# Patient Record
Sex: Female | Born: 1980 | Race: White | Hispanic: No | Marital: Single
Health system: Southern US, Community
[De-identification: ages and names within clinical notes are randomized; demographics above are authoritative.]

## PROBLEM LIST (undated history)

## (undated) ENCOUNTER — Inpatient Hospital Stay (HOSPITAL_COMMUNITY): Payer: Self-pay

## (undated) DIAGNOSIS — F32A Depression, unspecified: Secondary | ICD-10-CM

## (undated) DIAGNOSIS — F329 Major depressive disorder, single episode, unspecified: Secondary | ICD-10-CM

## (undated) DIAGNOSIS — F419 Anxiety disorder, unspecified: Secondary | ICD-10-CM

---

## 2010-07-20 ENCOUNTER — Emergency Department (HOSPITAL_COMMUNITY): Payer: PRIVATE HEALTH INSURANCE

## 2010-07-20 ENCOUNTER — Emergency Department (HOSPITAL_COMMUNITY)
Admission: EM | Admit: 2010-07-20 | Discharge: 2010-07-20 | Disposition: A | Discharge status: Home / Self Care | Payer: PRIVATE HEALTH INSURANCE | Attending: Emergency Medicine | Admitting: Emergency Medicine

## 2010-07-20 DIAGNOSIS — M545 Low back pain, unspecified: Secondary | ICD-10-CM | POA: Insufficient documentation

## 2010-07-20 DIAGNOSIS — O200 Threatened abortion: Secondary | ICD-10-CM | POA: Insufficient documentation

## 2010-07-20 DIAGNOSIS — R109 Unspecified abdominal pain: Secondary | ICD-10-CM | POA: Insufficient documentation

## 2010-07-20 LAB — URINALYSIS, ROUTINE W REFLEX MICROSCOPIC
Bilirubin Urine: NEGATIVE
Ketones, ur: NEGATIVE mg/dL
Specific Gravity, Urine: 1.017 (ref 1.005–1.030)
Urobilinogen, UA: 0.2 mg/dL (ref 0.0–1.0)

## 2010-07-20 LAB — WET PREP, GENITAL: Yeast Wet Prep HPF POC: NONE SEEN

## 2010-07-20 LAB — POCT PREGNANCY, URINE: Preg Test, Ur: NEGATIVE

## 2010-07-20 LAB — URINE MICROSCOPIC-ADD ON

## 2010-07-22 LAB — GC/CHLAMYDIA PROBE AMP, GENITAL: GC Probe Amp, Genital: NEGATIVE

## 2011-04-24 ENCOUNTER — Encounter (HOSPITAL_COMMUNITY): Payer: Self-pay

## 2011-04-24 ENCOUNTER — Inpatient Hospital Stay (HOSPITAL_COMMUNITY)
Admission: AD | Admit: 2011-04-24 | Discharge: 2011-04-24 | Disposition: A | Discharge status: Home / Self Care | Payer: No Typology Code available for payment source | Source: Ambulatory Visit | Attending: Obstetrics and Gynecology | Admitting: Obstetrics and Gynecology

## 2011-04-24 ENCOUNTER — Other Ambulatory Visit: Payer: Self-pay | Admitting: Obstetrics and Gynecology

## 2011-04-24 DIAGNOSIS — O00109 Unspecified tubal pregnancy without intrauterine pregnancy: Secondary | ICD-10-CM | POA: Insufficient documentation

## 2011-04-24 LAB — DIFFERENTIAL
Basophils Relative: 0 % (ref 0–1)
Eosinophils Absolute: 0.2 10*3/uL (ref 0.0–0.7)
Monocytes Absolute: 0.9 10*3/uL (ref 0.1–1.0)
Monocytes Relative: 7 % (ref 3–12)

## 2011-04-24 LAB — CBC
HCT: 37.8 % (ref 36.0–46.0)
Hemoglobin: 13 g/dL (ref 12.0–15.0)
MCH: 31.3 pg (ref 26.0–34.0)
MCHC: 34.4 g/dL (ref 30.0–36.0)

## 2011-04-24 NOTE — Progress Notes (Signed)
Pt states called the office while waiting in [facility]. Was told she could forgo the methotrexate injection today and get a repeat ultrasound in the office on Monday. Patient has decided that she doesn't want the injection today because she wants to "make sure" its an ectopic. Called Dr. Billy Coast and received an order for the patient to be discharged without the injection.

## 2011-04-24 NOTE — Discharge Instructions (Signed)
Methotrexate injection What is this medicine? METHOTREXATE (METH oh TREX ate) is a chemotherapy drug. This medicine affects cells that are rapidly growing, such as cancer cells and cells in your mouth and stomach. It is used to treat many cancers and other medical conditions. It is used for leukemias, lymphomas, breast cancer, lung cancer, head and neck cancers, and other cancers. This medicine also works on the immune system and is commonly used to treat psoriasis and rheumatoid arthritis. This medicine may be used for other purposes; ask your health care provider or pharmacist if you have questions. What should I tell my health care provider before I take this medicine? They need to know if you have any of these conditions: -if you frequently drink alcohol containing drinks -infection (especially a virus infection such as chickenpox, cold sores, or herpes) -immune system problems -kidney disease -liver disease -low blood counts, like platelets, red bloods, or white blood cells -lung disease -recent or ongoing radiation therapy -an unusual or allergic reaction to methotrexate, benzyl alcohol, other medicines, foods, dyes, or preservatives -pregnant or trying to get pregnant -breast-feeding How should I use this medicine? This drug is given as an injection into a muscle or into a vein. It may also be given into the spinal fluid. It is administered in a hospital or clinic by a specially trained health care professional. Talk to your pediatrician regarding the use of this medicine in children. While this drug may be prescribed for selected conditions, precautions do apply. Overdosage: If you think you have taken too much of this medicine contact a poison control center or emergency room at once. NOTE: This medicine is only for you. Do not share this medicine with others. What if I miss a dose? It is important not to miss your dose. Call your doctor or health care professional if you are unable  to keep an appointment. What may interact with this medicine? -antibiotics and other medicines for infections -aspirin and aspirin-like medicines including bismuth subsalicylate (Pepto-Bismol) -cisplatin -dapsone -folic acid in supplements or vitamins -mercaptopurine -NSAIDs, medicines for pain and inflammation, like ibuprofen or naproxen -pemetrexed -phenylbutazone -phenytoin -probenecid -pyrimethamine -theophylline -trimetrexate -vaccines This list may not describe all possible interactions. Give your health care provider a list of all the medicines, herbs, non-prescription drugs, or dietary supplements you use. Also tell them if you smoke, drink alcohol, or use illegal drugs. Some items may interact with your medicine. What should I watch for while using this medicine? Visit your doctor for checks on your progress. You will need to have regular blood checks during your treatment to monitor your blood, liver function, and kidney function. This drug may make you feel generally unwell. This is not uncommon, as chemotherapy can affect healthy cells as well as cancer cells. Report any side effects. Continue your course of treatment even though you feel ill unless your doctor tells you to stop. In some cases, you may be given additional medicines to help with side effects. Follow all directions for their use. Call your doctor or health care professional for advice if you get a fever, chills or sore throat, or other symptoms of a cold or flu. Do not treat yourself. This drug decreases your body's ability to fight infections. Try to avoid being around people who are sick. This medicine may increase your risk to bruise or bleed. Call your doctor or health care professional if you notice any unusual bleeding. Be careful brushing and flossing your teeth or using a toothpick because   you may get an infection or bleed more easily. If you have any dental work done, tell your dentist you are receiving  this medicine. Avoid taking products that contain aspirin, acetaminophen, ibuprofen, naproxen, or ketoprofen unless instructed by your doctor. These medicines may hide a fever. This medicine can make you more sensitive to the sun. Keep out of the sun. If you cannot avoid being in the sun, wear protective clothing and use sunscreen. Do not use sun lamps or tanning beds/booths. Do not treat diarrhea with over the counter products. Contact your doctor if you have diarrhea. To protect your kidneys, drink water or other fluids as directed while you are taking this medicine. Do not drink alcohol-containing drinks while taking this medicine. Both alcohol and the medicine may cause damage to your liver. Men and women must use effective birth control while they are taking this medicine. Do not become pregnant while taking this medicine. Women must continue using effective birth control for 1 full menstrual cycle after stopping this medicine. Tell your doctor right away if you think that you or your partner might be pregnant. There is a potential for serious side effects to an unborn child. Talk to your health care professional or pharmacist for more information. Do not breast-feed an infant while taking this medicine. Men must continue effective birth control for 3 months after stopping this medicine. What side effects may I notice from receiving this medicine? Side effects that you should report to your doctor or health care professional as soon as possible: -allergic reactions like skin rash, itching or hives, swelling of the face, lips, or tongue -low blood counts - this medicine may decrease the number of white blood cells, red blood cells and platelets. You may be at increased risk for infections and bleeding. -signs of infection - fever or chills, cough, sore throat, pain or difficulty passing urine -signs of decreased platelets or bleeding - bruising, pinpoint red spots on the skin, black, tarry stools,  blood in the urine -signs of decreased red blood cells - unusually weak or tired, fainting spells, lightheadedness -breathing problems, like a dry cough -changes in vision -confusion, not alert -diarrhea -mouth or throat sores or ulcers -problems with balance, talking, walking -redness, blistering, peeling or loosening of the skin, including inside the mouth -seizures -trouble passing urine or change in the amount of urine -vomiting -yellowing of the eyes or skin Side effects that usually do not require medical attention (report to your doctor or health care professional if they continue or are bothersome): -change in skin color -eye irritation -hair loss -headache -loss of appetite -nausea -stomach upset This list may not describe all possible side effects. Call your doctor for medical advice about side effects. You may report side effects to FDA at 1-800-FDA-1088. Where should I keep my medicine? This drug is given in a hospital or clinic and will not be stored at home. NOTE: This sheet is a summary. It may not cover all possible information. If you have questions about this medicine, talk to your doctor, pharmacist, or health care provider.  2012, [employer]. (08/25/2007 11:13:24 AM)

## 2011-04-24 NOTE — Progress Notes (Signed)
Patient to [facility] from the office for methotrexate injection. States labs were drawn in the office.

## 2011-04-24 NOTE — Progress Notes (Signed)
Addended by: Olivia Mackie on: 04/24/2011 09:13 PM   Modules accepted: Orders

## 2011-04-27 ENCOUNTER — Encounter (HOSPITAL_COMMUNITY)
Admission: RE | Disposition: A | Discharge status: Home / Self Care | Payer: Self-pay | Source: Ambulatory Visit | Attending: Obstetrics and Gynecology

## 2011-04-27 ENCOUNTER — Encounter (HOSPITAL_COMMUNITY): Payer: Self-pay | Admitting: *Deleted

## 2011-04-27 ENCOUNTER — Ambulatory Visit (HOSPITAL_COMMUNITY): Payer: No Typology Code available for payment source | Admitting: Anesthesiology

## 2011-04-27 ENCOUNTER — Other Ambulatory Visit: Payer: Self-pay | Admitting: Obstetrics and Gynecology

## 2011-04-27 ENCOUNTER — Encounter (HOSPITAL_COMMUNITY): Payer: Self-pay | Admitting: Anesthesiology

## 2011-04-27 ENCOUNTER — Ambulatory Visit (HOSPITAL_COMMUNITY)
Admission: RE | Admit: 2011-04-27 | Discharge: 2011-04-27 | Disposition: A | Discharge status: Home / Self Care | Payer: No Typology Code available for payment source | Source: Ambulatory Visit | Attending: Obstetrics and Gynecology | Admitting: Obstetrics and Gynecology

## 2011-04-27 DIAGNOSIS — O00109 Unspecified tubal pregnancy without intrauterine pregnancy: Secondary | ICD-10-CM | POA: Insufficient documentation

## 2011-04-27 HISTORY — DX: Anxiety disorder, unspecified: F41.9

## 2011-04-27 HISTORY — DX: Major depressive disorder, single episode, unspecified: F32.9

## 2011-04-27 HISTORY — DX: Depression, unspecified: F32.A

## 2011-04-27 HISTORY — PX: LAPAROSCOPY: SHX197

## 2011-04-27 LAB — SURGICAL PCR SCREEN: MRSA, PCR: NEGATIVE

## 2011-04-27 SURGERY — LAPAROSCOPY OPERATIVE
Anesthesia: General | Site: Abdomen | Laterality: Right | Wound class: Clean Contaminated

## 2011-04-27 MED ORDER — LIDOCAINE HCL (CARDIAC) 20 MG/ML IV SOLN
INTRAVENOUS | Status: DC | PRN
  Administered 2011-04-27: 50 mg via INTRAVENOUS

## 2011-04-27 MED ORDER — FENTANYL CITRATE 0.05 MG/ML IJ SOLN
INTRAMUSCULAR | Status: AC
  Filled 2011-04-27: qty 5

## 2011-04-27 MED ORDER — MIDAZOLAM HCL 5 MG/5ML IJ SOLN
INTRAMUSCULAR | Status: DC | PRN
  Administered 2011-04-27: 2 mg via INTRAVENOUS

## 2011-04-27 MED ORDER — MUPIROCIN 2 % EX OINT
TOPICAL_OINTMENT | CUTANEOUS | Status: AC
  Administered 2011-04-27: 1 via NASAL
  Filled 2011-04-27: qty 22

## 2011-04-27 MED ORDER — NEOSTIGMINE METHYLSULFATE 1 MG/ML IJ SOLN
INTRAMUSCULAR | Status: DC | PRN
  Administered 2011-04-27: 3 mg via INTRAVENOUS

## 2011-04-27 MED ORDER — CEFAZOLIN SODIUM 1-5 GM-% IV SOLN
1.0000 g | INTRAVENOUS | Status: AC
  Administered 2011-04-27: 1 g via INTRAVENOUS

## 2011-04-27 MED ORDER — PROPOFOL 10 MG/ML IV EMUL
INTRAVENOUS | Status: AC
  Filled 2011-04-27: qty 50

## 2011-04-27 MED ORDER — KETOROLAC TROMETHAMINE 30 MG/ML IJ SOLN: 15.0000 mg | Freq: Once | INTRAMUSCULAR | Status: DC | PRN

## 2011-04-27 MED ORDER — FENTANYL CITRATE 0.05 MG/ML IJ SOLN: 25.0000 ug | INTRAMUSCULAR | Status: DC | PRN

## 2011-04-27 MED ORDER — LACTATED RINGERS IV SOLN
INTRAVENOUS | Status: DC
  Administered 2011-04-27 (×2): via INTRAVENOUS

## 2011-04-27 MED ORDER — KETOROLAC TROMETHAMINE 30 MG/ML IJ SOLN
INTRAMUSCULAR | Status: DC | PRN
  Administered 2011-04-27: 30 mg via INTRAVENOUS

## 2011-04-27 MED ORDER — MUPIROCIN 2 % EX OINT
TOPICAL_OINTMENT | Freq: Two times a day (BID) | CUTANEOUS | Status: DC
  Administered 2011-04-27: 1 via NASAL

## 2011-04-27 MED ORDER — ONDANSETRON HCL 4 MG/2ML IJ SOLN
INTRAMUSCULAR | Status: DC | PRN
  Administered 2011-04-27: 4 mg via INTRAVENOUS

## 2011-04-27 MED ORDER — NEOSTIGMINE METHYLSULFATE 1 MG/ML IJ SOLN
INTRAMUSCULAR | Status: AC
  Filled 2011-04-27: qty 10

## 2011-04-27 MED ORDER — FENTANYL CITRATE 0.05 MG/ML IJ SOLN
INTRAMUSCULAR | Status: AC
  Filled 2011-04-27: qty 2

## 2011-04-27 MED ORDER — LIDOCAINE HCL (CARDIAC) 20 MG/ML IV SOLN
INTRAVENOUS | Status: AC
  Filled 2011-04-27: qty 5

## 2011-04-27 MED ORDER — INDIGOTINDISULFONATE SODIUM 8 MG/ML IJ SOLN
INTRAMUSCULAR | Status: AC
  Filled 2011-04-27: qty 5

## 2011-04-27 MED ORDER — BUPIVACAINE HCL (PF) 0.25 % IJ SOLN
INTRAMUSCULAR | Status: AC
  Filled 2011-04-27: qty 30

## 2011-04-27 MED ORDER — GLYCOPYRROLATE 0.2 MG/ML IJ SOLN
INTRAMUSCULAR | Status: AC
  Filled 2011-04-27: qty 2

## 2011-04-27 MED ORDER — OXYCODONE-ACETAMINOPHEN 5-325 MG PO TABS: 1.0000 | ORAL_TABLET | ORAL | Status: AC | PRN

## 2011-04-27 MED ORDER — ROCURONIUM BROMIDE 100 MG/10ML IV SOLN
INTRAVENOUS | Status: DC | PRN
  Administered 2011-04-27: 40 mg via INTRAVENOUS
  Administered 2011-04-27: 10 mg via INTRAVENOUS

## 2011-04-27 MED ORDER — CEFAZOLIN SODIUM 1-5 GM-% IV SOLN
INTRAVENOUS | Status: AC
  Filled 2011-04-27: qty 50

## 2011-04-27 MED ORDER — MIDAZOLAM HCL 2 MG/2ML IJ SOLN
INTRAMUSCULAR | Status: AC
  Filled 2011-04-27: qty 2

## 2011-04-27 MED ORDER — ONDANSETRON HCL 4 MG/2ML IJ SOLN
INTRAMUSCULAR | Status: AC
  Filled 2011-04-27: qty 2

## 2011-04-27 MED ORDER — ROCURONIUM BROMIDE 50 MG/5ML IV SOLN
INTRAVENOUS | Status: AC
  Filled 2011-04-27: qty 1

## 2011-04-27 MED ORDER — KETOROLAC TROMETHAMINE 30 MG/ML IJ SOLN
INTRAMUSCULAR | Status: AC
  Filled 2011-04-27: qty 1

## 2011-04-27 MED ORDER — BUPIVACAINE HCL (PF) 0.25 % IJ SOLN
INTRAMUSCULAR | Status: DC | PRN
  Administered 2011-04-27: 10 mL

## 2011-04-27 MED ORDER — PROPOFOL 10 MG/ML IV EMUL
INTRAVENOUS | Status: DC | PRN
  Administered 2011-04-27: 150 mg via INTRAVENOUS

## 2011-04-27 MED ORDER — FENTANYL CITRATE 0.05 MG/ML IJ SOLN
INTRAMUSCULAR | Status: DC | PRN
  Administered 2011-04-27: 50 ug via INTRAVENOUS
  Administered 2011-04-27: 150 ug via INTRAVENOUS
  Administered 2011-04-27: 50 ug via INTRAVENOUS
  Administered 2011-04-27 (×2): 25 ug via INTRAVENOUS
  Administered 2011-04-27: 50 ug via INTRAVENOUS

## 2011-04-27 MED ORDER — GLYCOPYRROLATE 0.2 MG/ML IJ SOLN
INTRAMUSCULAR | Status: DC | PRN
  Administered 2011-04-27: .6 mg via INTRAVENOUS

## 2011-04-27 SURGICAL SUPPLY — 27 items
CABLE HIGH FREQUENCY MONO STRZ (ELECTRODE) ×4 IMPLANT
CATH ROBINSON RED A/P 16FR (CATHETERS) IMPLANT
CLOTH BEACON ORANGE TIMEOUT ST (SAFETY) ×4 IMPLANT
DERMABOND ADVANCED (GAUZE/BANDAGES/DRESSINGS) ×1
DERMABOND ADVANCED .7 DNX12 (GAUZE/BANDAGES/DRESSINGS) ×3 IMPLANT
FORCEPS CUTTING 33CM 5MM (CUTTING FORCEPS) ×4 IMPLANT
FORCEPS CUTTING 45CM 5MM (CUTTING FORCEPS) IMPLANT
GLOVE BIO SURGEON STRL SZ7.5 (GLOVE) ×8 IMPLANT
GOWN PREVENTION PLUS LG XLONG (DISPOSABLE) ×8 IMPLANT
GOWN PREVENTION PLUS XLARGE (GOWN DISPOSABLE) ×4 IMPLANT
NEEDLE INSUFFLATION 14GA 120MM (NEEDLE) IMPLANT
PACK LAPAROSCOPY BASIN (CUSTOM PROCEDURE TRAY) ×4 IMPLANT
PENCIL BUTTON HOLSTER BLD 10FT (ELECTRODE) ×4 IMPLANT
POUCH SPECIMEN RETRIEVAL 10MM (ENDOMECHANICALS) ×4 IMPLANT
PROTECTOR NERVE ULNAR (MISCELLANEOUS) ×4 IMPLANT
SET IRRIG TUBING LAPAROSCOPIC (IRRIGATION / IRRIGATOR) ×4 IMPLANT
SLEEVE Z-THREAD 5X100MM (TROCAR) ×4 IMPLANT
SOLUTION ELECTROLUBE (MISCELLANEOUS) ×4 IMPLANT
SUT VICRYL 0 UR6 27IN ABS (SUTURE) ×8 IMPLANT
SUT VICRYL 4-0 PS2 18IN ABS (SUTURE) ×4 IMPLANT
TOWEL OR 17X24 6PK STRL BLUE (TOWEL DISPOSABLE) ×8 IMPLANT
TRAY FOLEY CATH 14FR (SET/KITS/TRAYS/PACK) ×4 IMPLANT
TROCAR HASSON GELL 12X100 (TROCAR) ×4 IMPLANT
TROCAR Z-THREAD BLADED 11X100M (TROCAR) IMPLANT
TROCAR Z-THREAD BLADED 5X100MM (TROCAR) ×4 IMPLANT
WARMER LAPAROSCOPE (MISCELLANEOUS) ×4 IMPLANT
WATER STERILE IRR 1000ML POUR (IV SOLUTION) IMPLANT

## 2011-04-27 NOTE — Progress Notes (Signed)
Patient ID: Anita Chen, female   DOB: April 04, 1980, 31 y.o.   MRN: [id number] Patient seen and examined. Consent witnessed and signed. No changes noted. Update completed.

## 2011-04-27 NOTE — Op Note (Signed)
04/27/2011  4:23 PM  PATIENT:  Liticia Delford Field  31 y.o. female  PRE-OPERATIVE DIAGNOSIS:  ectopic  POST-OPERATIVE DIAGNOSIS:  ectopic  PROCEDURE:  Procedure(s): LAPAROSCOPY OPERATIVE UNILATERAL SALPINGECTOMY(right) Lysis of adhesions  SURGEON:  Surgeon(s): Lenoard Aden, MD  ASSISTANTS: none   ANESTHESIA:   local and general  ESTIMATED BLOOD LOSS: * No blood loss amount entered *   DRAINS: Urinary Catheter (Foley)   LOCAL MEDICATIONS USED:  MARCAINE     SPECIMEN:  Source of Specimen:  right tube with ectopic  DISPOSITION OF SPECIMEN:  PATHOLOGY  COUNTS:  YES  DICTATION #: [id number]  PLAN OF CARE: DC home  PATIENT DISPOSITION:  PACU - hemodynamically stable.

## 2011-04-27 NOTE — Transfer of Care (Signed)
Immediate Anesthesia Transfer of Care Note  Patient: Anita Chen  Procedure(s) Performed: Procedure(s) (LRB): LAPAROSCOPY OPERATIVE (N/A) UNILATERAL SALPINGECTOMY (Right)  Patient Location: PACU  Anesthesia Type: General  Level of Consciousness: awake, alert  and oriented  Airway & Oxygen Therapy: Patient Spontanous Breathing and Patient connected to nasal cannula oxygen  Post-op Assessment: Report given to PACU RN and Post -op Vital signs reviewed and stable  Post vital signs: Reviewed and stable  Complications: No apparent anesthesia complications

## 2011-04-27 NOTE — Anesthesia Procedure Notes (Signed)
Procedure Name: Intubation Date/Time: 04/27/2011 3:23 PM Performed by: Madison Hickman Pre-anesthesia Checklist: Patient identified, Emergency Drugs available, Suction available, Timeout performed and Patient being monitored Patient Re-evaluated:Patient Re-evaluated prior to inductionOxygen Delivery Method: Circle system utilized Preoxygenation: Pre-oxygenation with 100% oxygen Intubation Type: IV induction and Cricoid Pressure applied Laryngoscope Size: Mac and 3 Grade View: Grade I Tube type: Oral Tube size: 7.0 mm Number of attempts: 1 Airway Equipment and Method: Stylet Placement Confirmation: ETT inserted through vocal cords under direct vision,  positive ETCO2 and breath sounds checked- equal and bilateral Secured at: 21 cm Tube secured with: Tape Dental Injury: Teeth and Oropharynx as per pre-operative assessment

## 2011-04-27 NOTE — Anesthesia Preprocedure Evaluation (Signed)
Anesthesia Evaluation  Patient identified by MRN, date of birth, ID band Patient awake    Reviewed: Allergy & Precautions, H&P , NPO status , Patient's Chart, lab work & pertinent test results, reviewed documented beta blocker date and time   History of Anesthesia Complications Negative for: history of anesthetic complications  Airway Mallampati: I TM Distance: >3 FB Neck ROM: full    Dental  (+) Teeth Intact   Pulmonary neg pulmonary ROS,  clear to auscultation  Pulmonary exam normal       Cardiovascular neg cardio ROS regular Normal    Neuro/Psych PSYCHIATRIC DISORDERS (anxiety, depression) Negative Neurological ROS     GI/Hepatic negative GI ROS, Neg liver ROS,   Endo/Other  Negative Endocrine ROS  Renal/GU negative Renal ROS  Genitourinary negative   Musculoskeletal   Abdominal   Peds  Hematology negative hematology ROS (+)   Anesthesia Other Findings NPO since 10 am - ate 3 pork rinds and one bite cinnamon bun - emergency case per Dr Billy Coast  Reproductive/Obstetrics (+) Pregnancy (ectopic at 6 weeks)                           Anesthesia Physical Anesthesia Plan  ASA: II  Anesthesia Plan: General ETT   Post-op Pain Management:    Induction:   Airway Management Planned:   Additional Equipment:   Intra-op Plan:   Post-operative Plan:   Informed Consent: I have reviewed the patients History and Physical, chart, labs and discussed the procedure including the risks, benefits and alternatives for the proposed anesthesia with the patient or authorized representative who has indicated his/her understanding and acceptance.   Dental Advisory Given  Plan Discussed with: CRNA and Surgeon  Anesthesia Plan Comments:         Anesthesia Quick Evaluation

## 2011-04-27 NOTE — Anesthesia Postprocedure Evaluation (Signed)
  Anesthesia Post-op Note  Patient: Anita Chen  Procedure(s) Performed: Procedure(s) (LRB): LAPAROSCOPY OPERATIVE (N/A) UNILATERAL SALPINGECTOMY (Right) Patient is awake and responsive. Pain and nausea are reasonably well controlled. Vital signs are stable and clinically acceptable. Oxygen saturation is clinically acceptable. There are no apparent anesthetic complications at this time. Patient is ready for discharge.

## 2011-04-27 NOTE — Discharge Instructions (Signed)

## 2011-04-27 NOTE — H&P (Signed)
NAME:  Anita Chen, Anita Chen NO.:  [id number]  MEDICAL RECORD NO.:  [id number]  LOCATION:                                 FACILITY:  PHYSICIAN:  Lenoard Aden, M.D.     DATE OF BIRTH:  DATE OF ADMISSION: DATE OF DISCHARGE:                             HISTORY & PHYSICAL   CHIEF COMPLAINT:  Right ectopic with cardiac activity, right lower quadrant pain.  HISTORY OF PRESENT ILLNESS:  She is a 31 year old white female, G4, P 0- 3-0-0 with history of previous ectopic, treated with methotrexate who now presents with a history of a gestational sac in the right adnexa as noted 3 days ago.  The patient refused methotrexate.  Presents today with right-sided pain and fetal cardiac activity in the right adnexa, now for surgical therapy.  MEDICATIONS:  Prenatal vitamins.  No other medical, surgical, hospitalizations.  She has a history of SAB x2.  No history of PID.  She has allergies to BENZOYL PEROXIDE.  FAMILY HISTORY:  Diabetes, heart disease, hypertension, breast and ovarian cancer.  PHYSICAL EXAMINATION:  GENERAL:  She is a well-developed, well-nourished white female, in no acute distress. HEENT:  Normal. NECK:  Supple.  Full range of motion. LUNGS:  Clear. HEART:  Regular rhythm. ABDOMEN:  Soft, nontender. PELVIC:  Tenderness in the right adnexa. EXTREMITIES:  No cords. NEUROLOGIC:  Nonfocal. SKIN:  Intact.  IMPRESSION:  Right ectopic pregnancy with cardiac activity, right-sided pain, suspicious for impending rupture.  PLAN:  Proceed with urgent diagnostic laparoscopy, right salpingectomy with removal of ectopic.  Risks of anesthesia, infection, bleeding, injury to abdominal organs, and need for repair was discussed, delayed versus immediate complications to include bowel and bladder injury, possible need for repair noted.  The patient acknowledges and wishes to proceed.     Lenoard Aden, M.D.     RJT/MEDQ  D:  04/27/2011  T:   04/27/2011  Job:  [id number]

## 2011-04-28 NOTE — Op Note (Signed)
[name], [name]               ACCOUNT NO.:  [id number]  MEDICAL RECORD NO.:  [id number]  LOCATION:  [facility]                          FACILITY:  [facility]  PHYSICIAN:  Lenoard Aden, M.D.DATE OF BIRTH:  10-17-1980  DATE OF PROCEDURE:  04/27/2011 DATE OF DISCHARGE:  04/27/2011                              OPERATIVE REPORT   SURGEON:  Lenoard Aden, MD  DESCRIPTION OF PROCEDURE:  After being apprised of risks of anesthesia, infection, bleeding, injury to abdominal organs, need for repair, delayed versus immediate complications to include bowel and bladder injury, need for repair, the patient was brought to the operating room and was administered general anesthetic without complications.  Prepped and draped in usual sterile fashion.  Foley catheter was placed.  After achieving adequate anesthesia, dilute Marcaine solution was placed. Infraumbilical incision was made with scalpel.  Fascia was grasped and opened, and Hasson cannula was placed.  Please note that prior to this, a Hulka tenaculum was placed per vagina.  At this time, peritoneal entry was assured without evidence of trauma.  The laparoscope was placed. 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  At this time, two 5-mm ports were made on the right and left under direct visualization.  An Endo Shears were placed after elevating the right tube and ovary.  This complex was then dissected off the right pelvic sidewall.  Sharp lysis of adhesions was performed fraying the right adnexa from its attachments into the ovarian fossa. Good hemostasis was noted and then the gyrus was entered due to the fact that this was a recurrent ectopic on the right side.  Decision was made previously to remove the right tube, which was obviously unruptured with an  ampullary ectopic.  The mesosalpinx was cauterized and progressive bites using the gyrus and removed at the level of the cornua.  Good hemostasis was noted.  The specimen was placed and EndoCatch removed through the operative port and sent to pathology for permanent confirmation.  The left tube was difficult to visualize due to adnexal adhesions.  The proximal portion of the left tube appeared normal; however, this appeared to be significant adhesions in the left adnexa, which were not addressed today.  Good hemostasis was accomplished.  No bleeding from the right tube was noted after release of CO2.  All trocars were then removed under direct visualization.  CO2 was released. Incisions were closed using 0 Vicryl, 4-0 Vicryl, and Dermabond.  The Hulka tenaculum was removed from the vagina.  The patient tolerated the procedure well, was awakened and transferred to recovery in good condition.     Lenoard Aden, M.D.     RJT/MEDQ  D:  04/27/2011  T:  04/28/2011  Job:  [id number]

## 2011-04-29 ENCOUNTER — Encounter (HOSPITAL_COMMUNITY): Payer: Self-pay | Admitting: Obstetrics and Gynecology

## 2011-06-08 ENCOUNTER — Other Ambulatory Visit (HOSPITAL_COMMUNITY): Payer: Self-pay | Admitting: Obstetrics and Gynecology

## 2011-06-15 ENCOUNTER — Encounter (HOSPITAL_COMMUNITY): Payer: Self-pay

## 2011-06-15 ENCOUNTER — Ambulatory Visit (HOSPITAL_COMMUNITY)
Admission: RE | Admit: 2011-06-15 | Discharge: 2011-06-15 | Disposition: A | Discharge status: Home / Self Care | Payer: No Typology Code available for payment source | Source: Ambulatory Visit | Attending: Obstetrics and Gynecology | Admitting: Obstetrics and Gynecology

## 2011-06-15 DIAGNOSIS — N979 Female infertility, unspecified: Secondary | ICD-10-CM | POA: Insufficient documentation

## 2011-06-15 MED ORDER — IOHEXOL 300 MG/ML  SOLN: 7.0000 mL | Freq: Once | INTRAMUSCULAR | Status: AC | PRN

## 2013-05-29 IMAGING — RF DG HYSTEROGRAM
5 series · 5 of 5 positions shown · IV contrast (omnipaque)
Comparison: none

CLINICAL DATA: Infertility, history of right salpingectomy for
ectopic pregnancy April 2010

HYSTEROSALPINGOGRAM
TECHNIQUE: Following cleansing of the cervix and vagina with
Betadine solution, a hysterosalpingogram was performed using a 5-
French hysterosalpingogram catheter and Omnipaque 300 contrast.
The patient tolerated the exam without difficulty.
Fluoroscopy time:  0.7 minutes

[Series 1: run · 1 of 1 slices shown (1 of 5)]
[im 1/1]
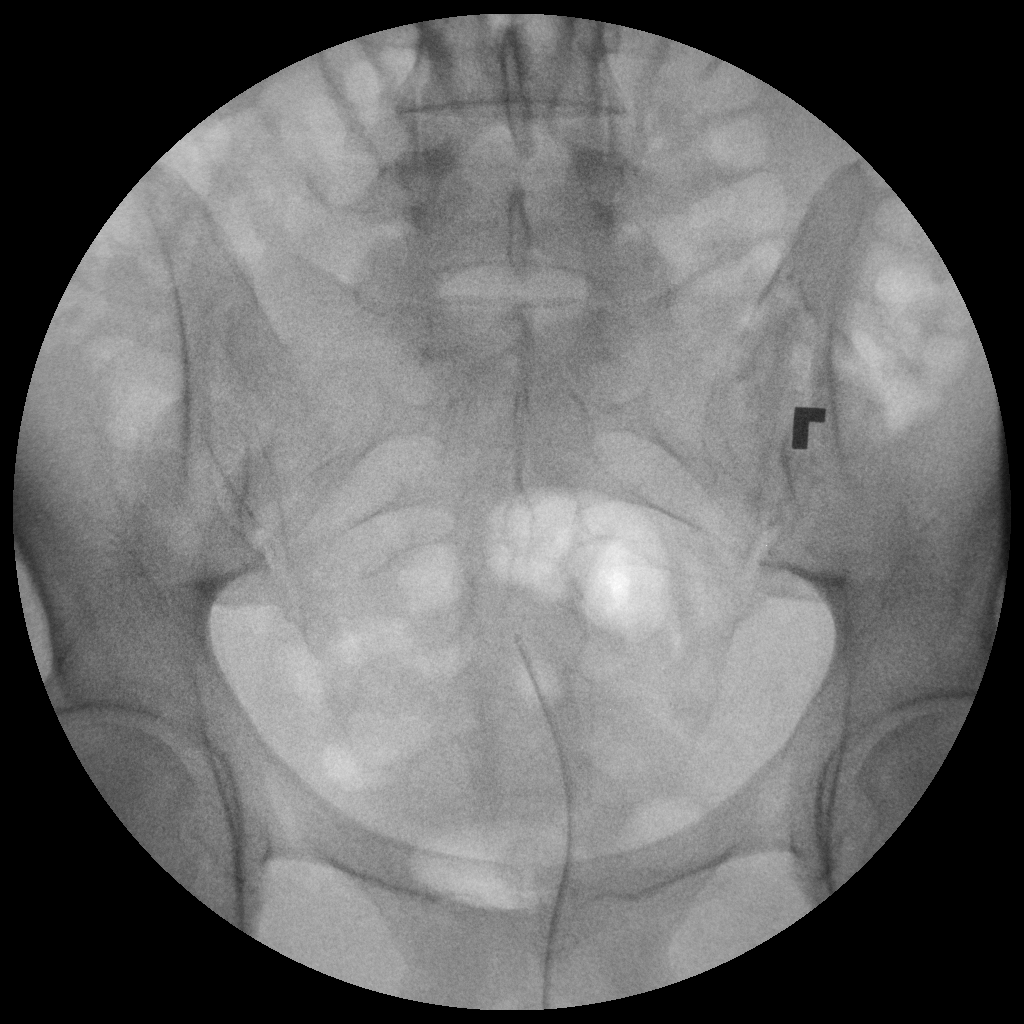

[Series 2: run · 1 of 1 slices shown (2 of 5)]
[im 1/1]
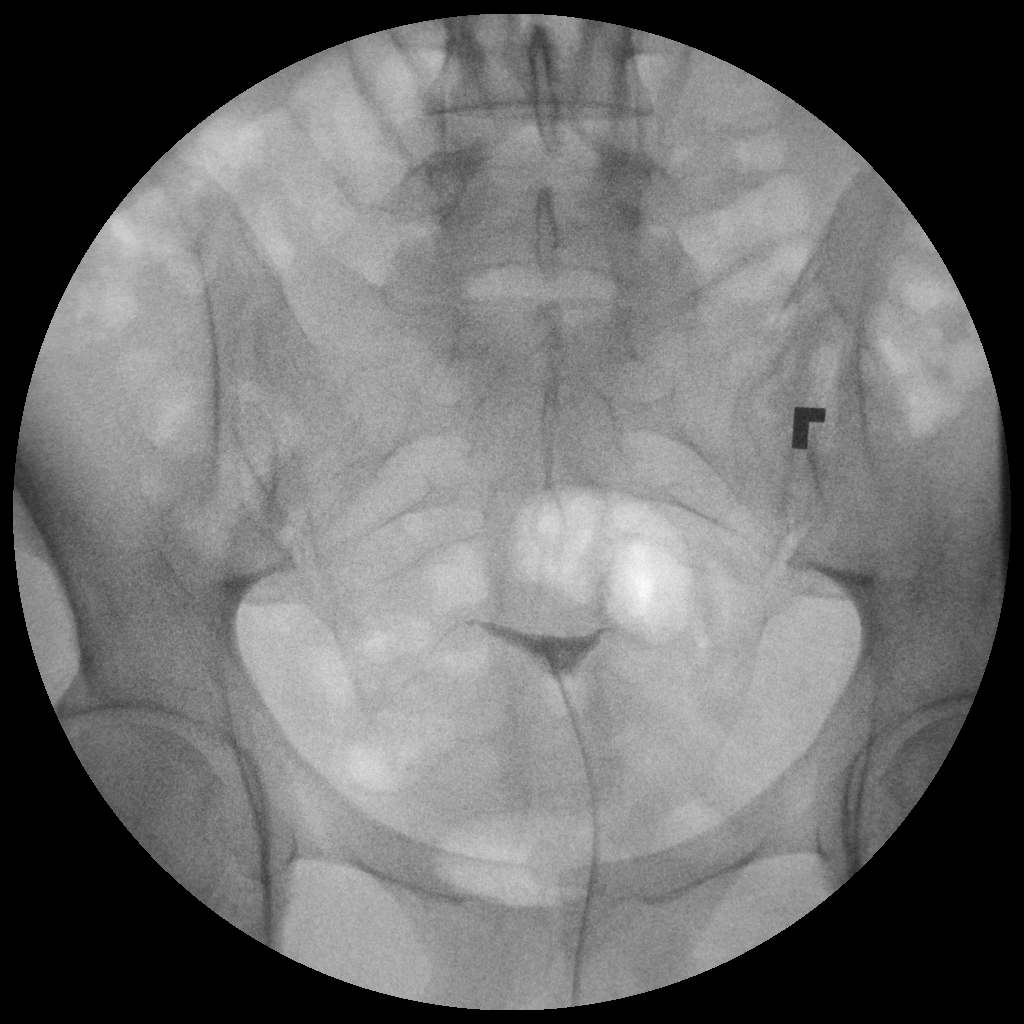

[Series 3: run · 1 of 1 slices shown (3 of 5)]
[im 1/1]
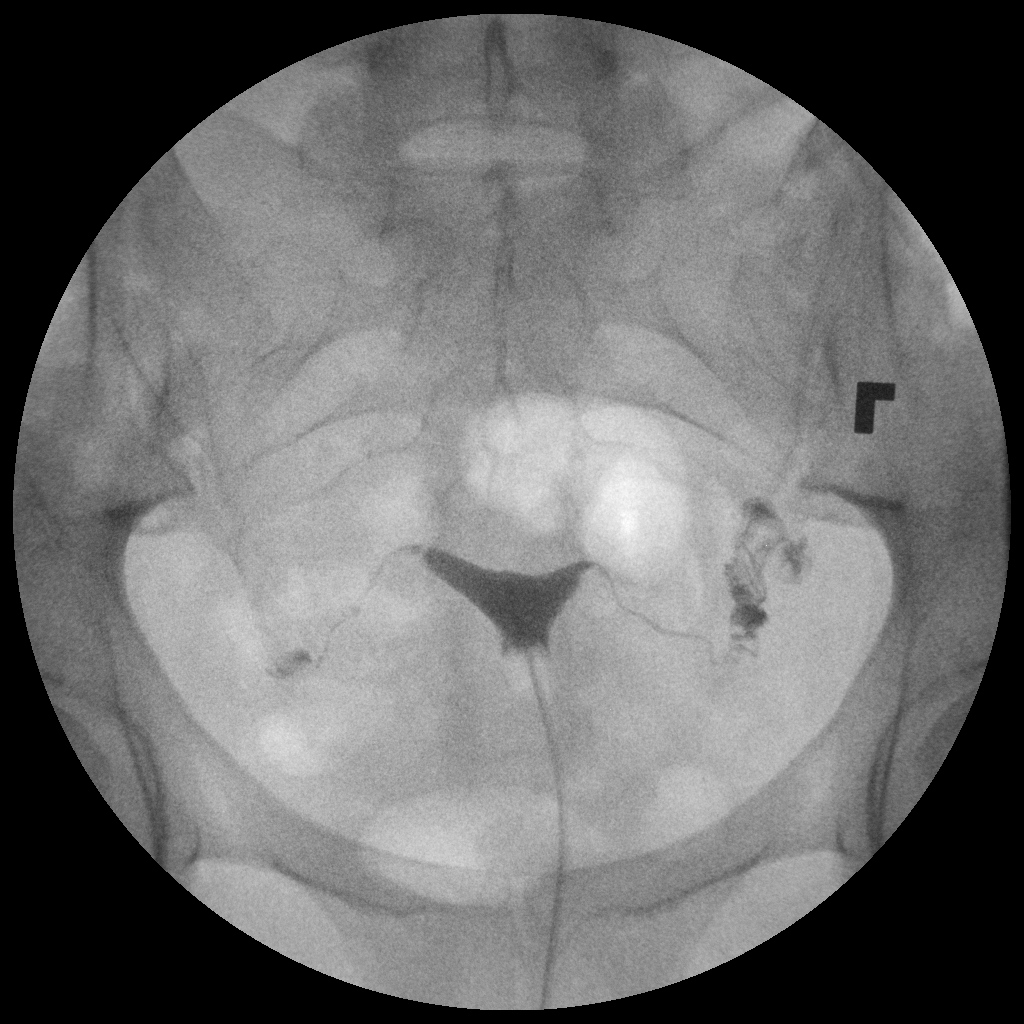

[Series 4: run · 1 of 1 slices shown (4 of 5)]
[im 1/1]
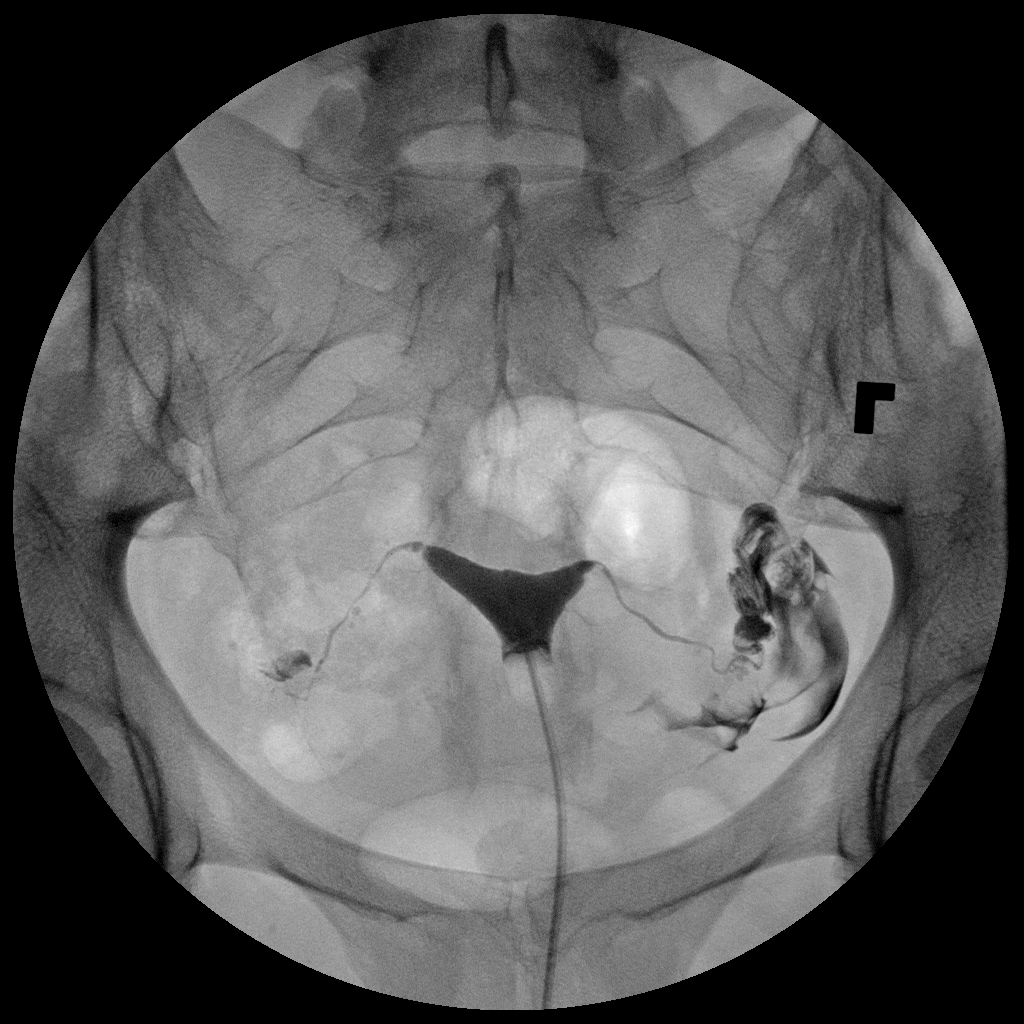

[Series 5: run · 1 of 1 slices shown (5 of 5)]
[im 1/1]
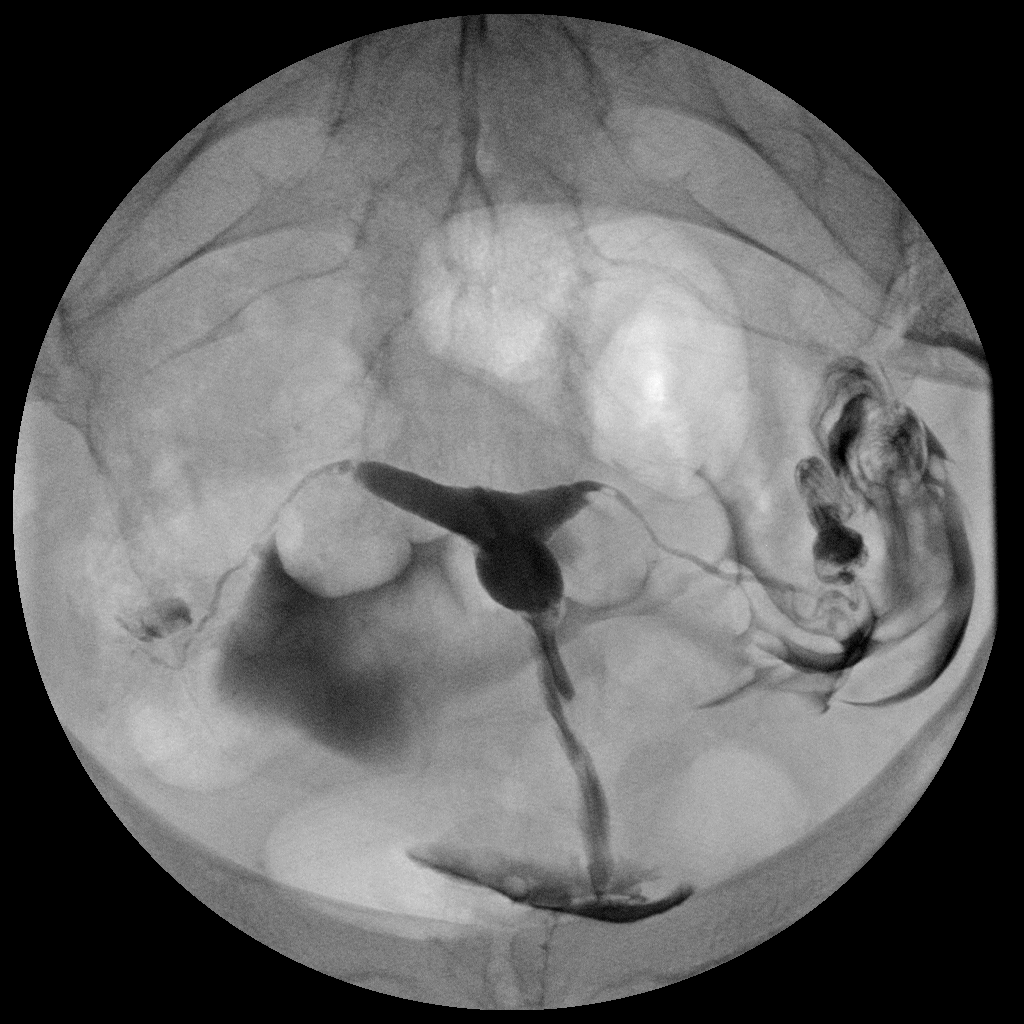

[5 of 5 positions shown; findings below may reference images not displayed]

FINDINGS: The endometrial cavity of the uterus is normal in contour
and appearance.

Contrast filling of both normal caliber fallopian tubes is seen,
and the left fallopian tube is normal in appearance.
Intraperitoneal spill of contrast from the left fallopian tube is
demonstrated.  Visualized portions of the right fallopian tube are
normal in caliber and appearance.  There is apparent loculation of
contrast at the distal aspect of the visualized right fallopian
tube without free intraperitoneal spill.
IMPRESSION: The left fallopian tube is normal in course and
caliber, and free left-sided intraperitoneal spill is identified.

There is truncation of the normal caliber right fallopian tube with
apparent distal loculation of contrast, compatible with partial
salpingectomy.

## 2013-11-06 ENCOUNTER — Encounter (HOSPITAL_COMMUNITY): Payer: Self-pay | Admitting: Emergency Medicine

## 2013-11-06 ENCOUNTER — Emergency Department (HOSPITAL_COMMUNITY)
Admission: EM | Admit: 2013-11-06 | Discharge: 2013-11-07 | Disposition: A | Discharge status: Home / Self Care | Payer: PRIVATE HEALTH INSURANCE | Attending: Emergency Medicine | Admitting: Emergency Medicine

## 2013-11-06 DIAGNOSIS — F1910 Other psychoactive substance abuse, uncomplicated: Secondary | ICD-10-CM

## 2013-11-06 DIAGNOSIS — R000 Tachycardia, unspecified: Secondary | ICD-10-CM | POA: Insufficient documentation

## 2013-11-06 DIAGNOSIS — F1210 Cannabis abuse, uncomplicated: Secondary | ICD-10-CM | POA: Insufficient documentation

## 2013-11-06 DIAGNOSIS — F3289 Other specified depressive episodes: Secondary | ICD-10-CM | POA: Insufficient documentation

## 2013-11-06 DIAGNOSIS — F329 Major depressive disorder, single episode, unspecified: Secondary | ICD-10-CM | POA: Insufficient documentation

## 2013-11-06 DIAGNOSIS — R4182 Altered mental status, unspecified: Secondary | ICD-10-CM | POA: Insufficient documentation

## 2013-11-06 DIAGNOSIS — Z3202 Encounter for pregnancy test, result negative: Secondary | ICD-10-CM | POA: Insufficient documentation

## 2013-11-06 DIAGNOSIS — F411 Generalized anxiety disorder: Secondary | ICD-10-CM | POA: Insufficient documentation

## 2013-11-06 DIAGNOSIS — Z79899 Other long term (current) drug therapy: Secondary | ICD-10-CM | POA: Insufficient documentation

## 2013-11-06 NOTE — ED Notes (Signed)
Pt was found in passenger seat of car at local hotel. Pt is very lethargic and not answering questions for [place] at this time.

## 2013-11-06 NOTE — ED Provider Notes (Signed)
CSN: [id number]     Arrival date & time 11/06/13  2343 History  This chart was scribed for Doug Sou, MD by Milly Jakob, ED Scribe. The patient was seen in room APA18/APA18. Patient's care was started at 11:52 PM.    level 5 caveat--altered mental status. History is obtained from police and from paramedics Chief Complaint  Patient presents with  . Altered Mental Status   Patient is a 33 y.o. female presenting with altered mental status. The history is provided by the EMS personnel, the police and the patient. The history is limited by the condition of the patient. No language interpreter was used.  Altered Mental Status  Level 5 Caveat: Altered Mental Status HPI Comments: Anita Chen is a 33 y.o. female who was brought into the Emergency Department by the EMS. She admits to "hitting a blunt" and she states that she feels like it was laced. She reports drinking alcohol tonight.She states that she does not smoke cigarettes. She denies any allergies to medications. Police report that she called 911 herself. She was found in the passenger's seat of her car. She had no treatment before coming here. Patient refused IV catheter and CBG by paramedic  Past Medical History  Diagnosis Date  . Anxiety   . Depression    Past Surgical History  Procedure Laterality Date  . Laparoscopy  04/27/2011    Procedure: LAPAROSCOPY OPERATIVE;  Surgeon: Lenoard Aden, MD;  Location: [facility];  Service: Gynecology;  Laterality: N/A;   No family history on file. History  Substance Use Topics  . Smoking status: Passive Smoke Exposure - Never Smoker  . Smokeless tobacco: Not on file  . Alcohol Use: No   positive alcohol use. Admits to illicit drug use OB History   Grav Para Term Preterm Abortions TAB SAB Ect Mult Living   1              Review of Systems  Unable to perform ROS Level 5 Caveat: Altered Mental Status  Allergies  Proxacol  Home Medications   Prior to Admission medications    Medication Sig Start Date End Date Taking? Authorizing Provider  ALPRAZolam (XANAX XR) 0.5 MG 24 hr tablet Take 0.5 mg by mouth daily as needed. For anxiety    Historical Provider, MD  citalopram (CELEXA) 20 MG tablet Take 20 mg by mouth daily.    Historical Provider, MD  Prenatal Vit-Fe Fumarate-FA (PRENATAL MULTIVITAMIN) TABS Take 1 tablet by mouth daily.    Historical Provider, MD   Triage Vitals: BP 104/70  Pulse 122  Temp(Src) 97.9 F (36.6 C) (Oral)  Resp 19  SpO2 94% Physical Exam  Nursing note and vitals reviewed. Constitutional: She appears well-developed and well-nourished.  Mildly uncooperative with exam  HENT:  Head: Normocephalic and atraumatic.  Eyes: Conjunctivae are normal. Pupils are equal, round, and reactive to light.  4 mm bilaterally  Neck: Neck supple. No tracheal deviation present. No thyromegaly present.  Cardiovascular: Regular rhythm.  Tachycardia present.   No murmur heard. Pulmonary/Chest: Effort normal and breath sounds normal.  Abdominal: Soft. Bowel sounds are normal. She exhibits no distension. There is no tenderness.  Musculoskeletal: Normal range of motion. She exhibits no edema and no tenderness.  Neurological: She is alert. She displays normal reflexes. No cranial nerve deficit. Coordination normal.  Follows simple commands. Moves all extremities. Speech slurred. DTRs symmetric bilaterally knee jerk ankle jerk biceps toes are bilaterally  Skin: Skin is warm and dry. No rash  noted.  Psychiatric: She has a normal mood and affect.    ED Course  Procedures (including critical care time)  11:56 PM-Discussed treatment plan with pt at bedside and pt agreed to plan.   Labs Review Labs Reviewed - No data to display  Imaging Review No results found.   EKG Interpretation   Date/Time:  Monday November 06 2013 23:46:25 EDT Ventricular Rate:  111 PR Interval:  160 QRS Duration: 83 QT Interval:  349 QTC Calculation: 474 R Axis:   71 Text  Interpretation:  Sinus tachycardia Right atrial enlargement Consider  right ventricular hypertrophy No old tracing to compare Confirmed by  Michille Mcelrath  MD, Kevia Zaucha ([id number]) on 11/07/2013 12:06:38 AM     1:15 AM patient sleepy easily arousable to gentle tactile stimulus. Upon awakening she states "I'm okay" speech is no longer slurred 5:30 AM patient is asymptomatic. She is alert Glasgow Coma Score 15 no distress feels ambulates without difficulty Results for orders placed during the hospital encounter of 11/06/13  COMPREHENSIVE METABOLIC PANEL      Result Value Ref Range   Sodium 141  137 - 147 mEq/L   Potassium 3.5 (*) 3.7 - 5.3 mEq/L   Chloride 103  96 - 112 mEq/L   CO2 24  19 - 32 mEq/L   Glucose, Bld 138 (*) 70 - 99 mg/dL   BUN 9  6 - 23 mg/dL   Creatinine, Ser 1.61  0.50 - 1.10 mg/dL   Calcium 8.7  8.4 - 09.6 mg/dL   Total Protein 7.1  6.0 - 8.3 g/dL   Albumin 3.9  3.5 - 5.2 g/dL   AST 14  0 - 37 U/L   ALT 9  0 - 35 U/L   Alkaline Phosphatase 37 (*) 39 - 117 U/L   Total Bilirubin 0.3  0.3 - 1.2 mg/dL   GFR calc non Af Amer >90  >90 mL/min   GFR calc Af Amer >90  >90 mL/min   Anion gap 14  5 - 15  CBC WITH DIFFERENTIAL      Result Value Ref Range   WBC 8.5  4.0 - 10.5 K/uL   RBC 4.41  3.87 - 5.11 MIL/uL   Hemoglobin 14.0  12.0 - 15.0 g/dL   HCT 04.5  40.9 - 81.1 %   MCV 93.2  78.0 - 100.0 fL   MCH 31.7  26.0 - 34.0 pg   MCHC 34.1  30.0 - 36.0 g/dL   RDW 91.4  78.2 - 95.6 %   Platelets 273  150 - 400 K/uL   Neutrophils Relative % 58  43 - 77 %   Neutro Abs 5.0  1.7 - 7.7 K/uL   Lymphocytes Relative 30  12 - 46 %   Lymphs Abs 2.6  0.7 - 4.0 K/uL   Monocytes Relative 8  3 - 12 %   Monocytes Absolute 0.7  0.1 - 1.0 K/uL   Eosinophils Relative 3  0 - 5 %   Eosinophils Absolute 0.2  0.0 - 0.7 K/uL   Basophils Relative 1  0 - 1 %   Basophils Absolute 0.0  0.0 - 0.1 K/uL  PREGNANCY, URINE      Result Value Ref Range   Preg Test, Ur NEGATIVE  NEGATIVE  URINE RAPID DRUG SCREEN  (HOSP PERFORMED)      Result Value Ref Range   Opiates NONE DETECTED  NONE DETECTED   Cocaine NONE DETECTED  NONE DETECTED   Benzodiazepines NONE DETECTED  NONE DETECTED   Amphetamines NONE DETECTED  NONE DETECTED   Tetrahydrocannabinol POSITIVE (*) NONE DETECTED   Barbiturates NONE DETECTED  NONE DETECTED  ETHANOL      Result Value Ref Range   Alcohol, Ethyl (B) <11  0 - 11 mg/dL  SALICYLATE LEVEL      Result Value Ref Range   Salicylate Lvl <2.0 (*) 2.8 - 20.0 mg/dL  ACETAMINOPHEN LEVEL      Result Value Ref Range   Acetaminophen (Tylenol), Serum <15.0  10 - 30 ug/mL  CBG MONITORING, ED      Result Value Ref Range   Glucose-Capillary 139 (*) 70 - 99 mg/dL   Comment 1 Documented in Chart     Comment 2 Notify RN     No results found.  MDM  Plan referral resource guide Final diagnoses:  None  #1 diagnosis drug abuse #2 hypokalemia #3 hyperglycemia    I personally performed the services described in this documentation, which was scribed in my presence. The recorded information has been reviewed and considered.   Doug Sou, MD 11/07/13 [phone]

## 2013-11-07 LAB — COMPREHENSIVE METABOLIC PANEL
ALBUMIN: 3.9 g/dL (ref 3.5–5.2)
ALT: 9 U/L (ref 0–35)
AST: 14 U/L (ref 0–37)
Alkaline Phosphatase: 37 U/L — ABNORMAL LOW (ref 39–117)
Anion gap: 14 (ref 5–15)
BILIRUBIN TOTAL: 0.3 mg/dL (ref 0.3–1.2)
BUN: 9 mg/dL (ref 6–23)
CHLORIDE: 103 meq/L (ref 96–112)
CO2: 24 meq/L (ref 19–32)
CREATININE: 0.74 mg/dL (ref 0.50–1.10)
Calcium: 8.7 mg/dL (ref 8.4–10.5)
GFR calc Af Amer: >90 mL/min (ref >90)
Glucose, Bld: 138 mg/dL — ABNORMAL HIGH (ref 70–99)
POTASSIUM: 3.5 meq/L — AB (ref 3.7–5.3)
SODIUM: 141 meq/L (ref 137–147)
Total Protein: 7.1 g/dL (ref 6.0–8.3)

## 2013-11-07 LAB — RAPID URINE DRUG SCREEN, HOSP PERFORMED
AMPHETAMINES: NOT DETECTED
BARBITURATES: NOT DETECTED
BENZODIAZEPINES: NOT DETECTED
Cocaine: NOT DETECTED
Opiates: NOT DETECTED
Tetrahydrocannabinol: POSITIVE — AB

## 2013-11-07 LAB — CBC WITH DIFFERENTIAL/PLATELET
Basophils Absolute: 0 10*3/uL (ref 0.0–0.1)
Basophils Relative: 1 % (ref 0–1)
EOS ABS: 0.2 10*3/uL (ref 0.0–0.7)
EOS PCT: 3 % (ref 0–5)
HEMATOCRIT: 41.1 % (ref 36.0–46.0)
Hemoglobin: 14 g/dL (ref 12.0–15.0)
LYMPHS ABS: 2.6 10*3/uL (ref 0.7–4.0)
LYMPHS PCT: 30 % (ref 12–46)
MCH: 31.7 pg (ref 26.0–34.0)
MCHC: 34.1 g/dL (ref 30.0–36.0)
MCV: 93.2 fL (ref 78.0–100.0)
MONO ABS: 0.7 10*3/uL (ref 0.1–1.0)
Monocytes Relative: 8 % (ref 3–12)
Neutro Abs: 5 10*3/uL (ref 1.7–7.7)
Neutrophils Relative %: 58 % (ref 43–77)
PLATELETS: 273 10*3/uL (ref 150–400)
RBC: 4.41 MIL/uL (ref 3.87–5.11)
RDW: 13.6 % (ref 11.5–15.5)
WBC: 8.5 10*3/uL (ref 4.0–10.5)

## 2013-11-07 LAB — PREGNANCY, URINE: PREG TEST UR: NEGATIVE

## 2013-11-07 LAB — CBG MONITORING, ED: Glucose-Capillary: 139 mg/dL — ABNORMAL HIGH (ref 70–99)

## 2013-11-07 LAB — ACETAMINOPHEN LEVEL: Acetaminophen (Tylenol), Serum: <15 ug/mL (ref 10–30)

## 2013-11-07 LAB — ETHANOL

## 2013-11-07 LAB — SALICYLATE LEVEL

## 2013-11-07 MED ORDER — POTASSIUM CHLORIDE CRYS ER 20 MEQ PO TBCR
40.0000 meq | EXTENDED_RELEASE_TABLET | Freq: Once | ORAL | Status: AC
  Administered 2013-11-07: 40 meq via ORAL
  Filled 2013-11-07: qty 2

## 2013-11-07 NOTE — ED Notes (Signed)
Pt awake, alert and orientated x 4. Requesting to go home. Able to ambulate without difficulty to bathroom.

## 2013-11-07 NOTE — Discharge Instructions (Signed)
Drug Abuse and Addiction  If you have a drug problem get help call any of the numbers on the resource guide for counseling. Your blood potassium was slightly low at tonight at 3.5. Your blood sugar was mildly elevated at 138. You may be borderline diabetic. Ask your primary care physician to check a test  you call a hemoglobin A1c There are many types of drugs that one may become addicted to including illegal drugs (marijuana, cocaine, amphetamines, hallucinogens, and narcotics), prescription drugs (hydrocodone, codeine, and alprazolam), and other chemicals such as alcohol or nicotine. Two types of addiction exist: physical and emotional. Physical addiction usually occurs after prolonged use of a drug. However, some drugs may only take a couple uses before addiction can occur. Physical addiction is marked by withdrawal symptoms in which the person experiences negative symptoms such as sweat, anxiety, tremors, hallucinations, or cravings in the absence of using the drug. Emotional dependence is the psychological desire for the "high" that the drugs produce when taken. SYMPTOMS   Inattentiveness.  Negligence.  Forgetfulness.  Insomnia.  Mood swings. RISK INCREASES WITH:   Family history of addiction.  Personal history of addictive personality. Studies have shown that risk takers, which many athletes are, have a higher risk of addiction. PREVENTION The only adequate prevention of drug abuse is abstinence from drugs. TREATMENT  The first step in quitting substance abuse is recognizing the problem and realizing that one has the power to change. Quitting requires a plan and support from others. It is often necessary to seek medical assistance. Caregivers are available to offer counseling, and for certain cases, medicine to diminish the physical symptoms of withdrawal. Many organizations exist such as Alcoholics Anonymous, Narcotics Anonymous, or the [employer] on Alcoholism that offer support  for individuals who have chosen to quit their habits. Document Released: 02/16/2005 Document Revised: 07/03/2013 Document Reviewed: 05/31/2008 [facility] Patient Information 2015 [place], [place]. This information is not intended to replace advice given to you by your health care provider. Make sure you discuss any questions you have with your health care provider.  Emergency Department Resource Guide 1) Find a Doctor and Pay Out of Pocket Although you won't have to find out who is covered by your insurance plan, it is a good idea to ask around and get recommendations. You will then need to call the office and see if the doctor you have chosen will accept you as a new patient and what types of options they offer for patients who are self-pay. Some doctors offer discounts or will set up payment plans for their patients who do not have insurance, but you will need to ask so you aren't surprised when you get to your appointment.  2) Contact Your Local Health Department Not all health departments have doctors that can see patients for sick visits, but many do, so it is worth a call to see if yours does. If you don't know where your local health department is, you can check in your phone book. The CDC also has a tool to help you locate your state's health department, and many state websites also have listings of all of their local health departments.  3) Find a Walk-in Clinic If your illness is not likely to be very severe or complicated, you may want to try a walk in clinic. These are popping up all over the country in pharmacies, drugstores, and shopping centers. They're usually staffed by nurse practitioners or physician assistants that have been trained to treat common  illnesses and complaints. They're usually fairly quick and inexpensive. However, if you have serious medical issues or chronic medical problems, these are probably not your best option.  No Primary Care Doctor: - Call Health Connect at   [phone] - they can help you locate a primary care doctor that  accepts your insurance, provides certain services, etc. - Physician Referral Service- [phone]  Chronic Pain Problems: Organization         Address  Phone   Notes  [facility]  [phone] Patients need to be referred by their primary care doctor.   Medication Assistance: Organization         Address  Phone   Notes  [facility] [address] [place] [place], [place] [address] [phone] --Must be a resident of [facility] -- Must have NO insurance coverage whatsoever (no Medicaid/ Medicare, etc.) -- The pt. MUST have a primary care doctor that directs their care regularly and follows them in the community   [facility]  [phone]   [employer]  [phone]    Agencies that provide inexpensive medical care: Organization         Address  Phone   Notes  [facility]  [phone]   [facility]    [phone]   [facility] [address] [place], [place] [address] [phone]   [facility] [address] [place] [place] [phone]   [facility]anned Parenthood    [phone]   [facility]    [phone]   [facility] and [facility]  [address], [place] Phone:  [phone], Fax:  [phone] Hours of Operation:  9 am - 6 pm, M-F.  Also accepts Medicaid/Medicare and self-pay.  [facility]  [address], [place] Phone: [phone], Fax: [phone]. Hours of Operation:  8:30 am - 5:30 pm, M-F.  Also accepts Medicaid and self-pay.  [facility] [place] [address] [place] Phone: [phone]   [facility] [address] Natasha Bence [place], [place] [phone], Ext. [phone] Mondays & Thursdays: 7-9 AM.  First 15 patients are seen on a first come, first serve basis.     Medicaid-accepting [facility] Providers:  Organization         Address  Phone   Notes  [facility] [address], [place] [phone] Also accepts self-pay patients.  [facility] [address] Laurell Josephs [place], [place]  [phone]   [facility] [address], [place] [phone]   [facility] [address] [place] [phone]   Renaye Rakers [address], [place]   [phone] Only accepts [place] Access [place] patients after they have their name applied to their card.   Self-Pay (no insurance) in [facility]:  Organization         Address  Phone   Notes  Sickle Cell Patients, [facility] [address] [place], [place] [phone]   [facility] [address] [place], [place] [phone]   [facility] [place]  [address], [place] [phone]   [facility]/Dr. Osei-Bonsu  [address], [place] or [address], Laurell Josephs [address],  [place] [phone] Phone number for both [facility] and [place] locations is the same.  Urgent Medical and [facility] [address] [place] [phone]   [facility] [address] [place] or [address] [phone]   [facility] [address], [place] [phone], phone; [phone], fax Sees patients 1st and 3rd Saturday of every month.  Must not qualify for public or private insurance (i.e. Medicaid, Medicare, [place] Health Choice, Veterans' Benefits)  Household income should be no more than 200% of the poverty level The clinic cannot treat you if you are pregnant or think you are pregnant  Sexually transmitted diseases are not treated at the clinic.    Dental Care: Organization         Address  Phone  Notes  [facility]  Department of [facility] [address] [place], [place] [phone] Accepts children up to age 15 who are enrolled in [place] or [place] Health Choice; pregnant women with a Medicaid card; and children who have applied for Medicaid or [place] Health Choice, but were declined, whose parents can pay a reduced fee at time of service.  [facility] of [facility]  [address], [place] [phone] Accepts children up to age 20 who are enrolled in [place] or [place] Health Choice; pregnant women with a Medicaid card; and children who have applied for Medicaid or [place] Health Choice, but were declined, whose parents can pay a reduced fee at time of service.  [facility]  [address] [place], [place] [phone] Patients are seen by appointment only. Walk-ins are not accepted. [facility] will see patients 68 years of age and older. Monday - Tuesday (8am-5pm) Most Wednesdays (8:30-5pm) $30 per visit, cash only  [facility]  [address], [facility] [phone] Patients are seen by appointment only. Walk-ins are not accepted. [facility] will see patients 49 years of age and older. One Wednesday Evening (Monthly: Volunteer Based).  $30 per visit, cash only  [employer]  [phone] for adults; Children under age 36, call Graduate Pediatric Dentistry at [phone]. Children aged 40-14, please call [phone] to request a pediatric application.  Dental services are provided in all areas of dental care including fillings, crowns and bridges, complete and partial dentures, implants, gum treatment, root canals, and extractions. Preventive care is also provided. Treatment is provided to both adults and children. Patients are selected via a lottery and there is often a waiting list.   [facility] [address], [place]  [phone]  [email or web address].[email or web address]   [facility] [address] [place], [place] [phone], Ext. [phone] Second and Fourth Thursday of each month, opens at 6:30 AM; Clinic ends at 9 AM.  Patients are seen on a first-come first-served basis, and a limited number are seen during each clinic.   [facility]  [address] Ether Griffins [place], [place] [phone]   Eligibility Requirements You must have lived in [place], [place], or [place] counties for at least the last three months.   You cannot be eligible for state or federal sponsored [place], including [employer], [place], or [employer].   You generally cannot be eligible for healthcare insurance through your employer.    How to apply: Eligibility screenings are held every Tuesday and  Wednesday afternoon from 1:00 pm until 4:00 pm. You do not need an appointment for the interview!  [facility] [address] [place], [place] [phone]   [facility] Health Department  [phone]   [facility] Health Department  [phone]   [facility] Health Department  [phone]    Behavioral Health Resources in the Community: Intensive Outpatient Programs Organization         Address  Phone  Notes  [facility] Services [address] [place], [place] [phone]   [facility] Outpatient [address] [place], [place] [phone]   [facility] [address] [place], [place]  [phone]   [facility] Mental Health [address]  [place], [place] [phone] or [phone]   Substance Abuse Resources Organization         Address  Phone  Notes  [facility] Drug Services  [phone]   [facility]  [phone]   [facility]  [phone]   Floydene Flock  [phone]   Residential & Outpatient Substance Abuse Program  [phone]   Psychological Services Organization          Address  Phone  Notes  [facility] Health  [phone]   [facility] Services  [phone]   [facility] Mental Health [address], [place] [phone] or [phone]    Mobile Crisis Teams Organization         Address  Phone  Notes  [facility], Mobile Crisis Care Unit  [phone]   [facility]  [address] [place], [place] [phone]   Doristine Locks [address] [place] [phone]    Self-Help/Support Groups Organization         Address  Phone             Notes  [facility] [place] - variety of support groups  336- I7437963 Call for more information  Narcotics Anonymous (NA), [facility] [address], [employer] [place]  2 meetings at this location   Statistician         Address  Phone  Notes  [facility] [address],    [place]  [phone]   [facility]  [address] [place] [address], [place], [place] [phone]   [facility] Treatment Facility [address] [place], [place] [phone] Admissions: 8am-3pm M-F  [facility] [address],    [place], [place] [phone]   [facility] [address] [place], [place], [place] [phone]   [facility] [place] [address],  [place], [place] [phone]   [facility] - Intensive Outpatient [address] Laurell Josephs [address], [place], [place] [phone]   [facility] ([facility] [address] [place]  [place], [place] [phone] or [phone]   [facility] ([facility]) [address], [place], [place] [phone] Accepts Medicaid  [facility] [place] [address],  [place] [phone] Substance Abuse/Addiction Treatment   [facility] Organization         Address  Phone  Notes  [facility]  [phone]   Angie Fava, PhD [address] Ervin Knack [place], [place]   [phone] or [phone]   [facility]al   [address] [place], [place] [phone]   [facility] [address] [place], [place] [phone] Insurance/Medicaid/sponsorship through [employer]  and Families [address]                                    [place], [place] [phone] Therapy/tele-psych/case  [facility] [address]   [place], [place] [phone]    Dr. Lolly Mustache  [phone]   [facility] [place]  [employer] [facility] Dept. 1) [address] [place] 2) [address], [place] 3)  [address], [place] [phone]  [phone]   [facility] Child Abuse Hotline [phone] or [phone] (After Hours)

## 2013-11-07 NOTE — ED Notes (Signed)
Pt reports she smoked a blunt "but I've smoked a blunt a million times before and it never felt like this. I think I've been poisoned, they must have laced it". Pt's speech is sluggish, is able to follow commands and move all extremities, has little ability to hold head up. Pt lethargic. Unable to state what feels wrong specifically. Does admit to ETOH use tonight.

## 2013-11-07 NOTE — ED Notes (Signed)
MD at bedside. 

## 2014-01-01 ENCOUNTER — Encounter (HOSPITAL_COMMUNITY): Payer: Self-pay | Admitting: Emergency Medicine

## 2015-01-03 ENCOUNTER — Other Ambulatory Visit: Payer: Self-pay | Admitting: Nurse Practitioner

## 2015-01-03 ENCOUNTER — Other Ambulatory Visit (HOSPITAL_COMMUNITY)
Admission: RE | Admit: 2015-01-03 | Discharge: 2015-01-03 | Disposition: A | Discharge status: Home / Self Care | Payer: BLUE CROSS/BLUE SHIELD | Source: Ambulatory Visit | Attending: Nurse Practitioner | Admitting: Nurse Practitioner

## 2015-01-03 DIAGNOSIS — Z113 Encounter for screening for infections with a predominantly sexual mode of transmission: Secondary | ICD-10-CM | POA: Insufficient documentation

## 2015-01-03 DIAGNOSIS — Z1151 Encounter for screening for human papillomavirus (HPV): Secondary | ICD-10-CM | POA: Diagnosis present

## 2015-01-03 DIAGNOSIS — Z01419 Encounter for gynecological examination (general) (routine) without abnormal findings: Secondary | ICD-10-CM | POA: Diagnosis present

## 2015-01-07 LAB — CYTOLOGY - PAP

## 2015-05-20 MED FILL — CIPROFLOXACIN HCL 500 MG TA: 500 | 3 days supply | Qty: 6 | Fill #0

## 2016-09-07 MED FILL — HYDROXYZINE PAM 25 MG CAP: 25 | 10 days supply | Qty: 10 | Fill #0

## 2016-09-07 MED FILL — CITALOPRAM HBR 20 MG TABLET: 20 | 30 days supply | Qty: 30 | Fill #0

## 2016-11-20 MED FILL — CITALOPRAM HBR 20 MG TABLET: 20 | 30 days supply | Qty: 30 | Fill #1

## 2016-11-20 MED FILL — HYDROXYZINE PAM 25 MG CAP: 25 | 10 days supply | Qty: 10 | Fill #1

## 2017-05-15 DIAGNOSIS — F419 Anxiety disorder, unspecified: Secondary | ICD-10-CM | POA: Diagnosis not present
